# Patient Record
Sex: Female | Born: 1990 | Race: White | Hispanic: No | Marital: Single | Smoking: Never smoker
Health system: Southern US, Community
[De-identification: ages and names within clinical notes are randomized; demographics above are authoritative.]

## PROBLEM LIST (undated history)

## (undated) DIAGNOSIS — M1990 Unspecified osteoarthritis, unspecified site: Secondary | ICD-10-CM

---

## 2014-11-09 ENCOUNTER — Encounter (HOSPITAL_COMMUNITY): Payer: Self-pay

## 2014-11-09 ENCOUNTER — Emergency Department (HOSPITAL_COMMUNITY)
Admission: EM | Admit: 2014-11-09 | Discharge: 2014-11-10 | Disposition: A | Discharge status: Home / Self Care | Payer: BLUE CROSS/BLUE SHIELD | Attending: Emergency Medicine | Admitting: Emergency Medicine

## 2014-11-09 DIAGNOSIS — R1030 Lower abdominal pain, unspecified: Secondary | ICD-10-CM | POA: Diagnosis present

## 2014-11-09 DIAGNOSIS — Z880 Allergy status to penicillin: Secondary | ICD-10-CM | POA: Diagnosis not present

## 2014-11-09 DIAGNOSIS — K5900 Constipation, unspecified: Secondary | ICD-10-CM

## 2014-11-09 DIAGNOSIS — R109 Unspecified abdominal pain: Secondary | ICD-10-CM

## 2014-11-09 DIAGNOSIS — Z79899 Other long term (current) drug therapy: Secondary | ICD-10-CM | POA: Diagnosis not present

## 2014-11-09 DIAGNOSIS — M1990 Unspecified osteoarthritis, unspecified site: Secondary | ICD-10-CM | POA: Insufficient documentation

## 2014-11-09 DIAGNOSIS — Z3202 Encounter for pregnancy test, result negative: Secondary | ICD-10-CM | POA: Diagnosis not present

## 2014-11-09 HISTORY — DX: Unspecified osteoarthritis, unspecified site: M19.90

## 2014-11-09 NOTE — ED Notes (Signed)
Pt complains of abdominal pain for one week, no BM in 8 days, she's had ovarian cysts bursts before and the pain is similar but she's more concerned about a possible blockage

## 2014-11-10 ENCOUNTER — Emergency Department (HOSPITAL_COMMUNITY): Payer: BLUE CROSS/BLUE SHIELD

## 2014-11-10 LAB — URINALYSIS, ROUTINE W REFLEX MICROSCOPIC
Bilirubin Urine: NEGATIVE
Glucose, UA: NEGATIVE mg/dL
Ketones, ur: NEGATIVE mg/dL
Leukocytes, UA: NEGATIVE
Nitrite: NEGATIVE
PH: 7 (ref 5.0–8.0)
Protein, ur: NEGATIVE mg/dL
SPECIFIC GRAVITY, URINE: 1.03 (ref 1.005–1.030)
UROBILINOGEN UA: 1 mg/dL (ref 0.0–1.0)

## 2014-11-10 LAB — CBC WITH DIFFERENTIAL/PLATELET
BASOS ABS: 0.1 10*3/uL (ref 0.0–0.1)
Basophils Relative: 1 %
EOS PCT: 4 %
Eosinophils Absolute: 0.4 10*3/uL (ref 0.0–0.7)
HEMATOCRIT: 36.2 % (ref 36.0–46.0)
Hemoglobin: 12.6 g/dL (ref 12.0–15.0)
LYMPHS ABS: 3.9 10*3/uL (ref 0.7–4.0)
LYMPHS PCT: 37 %
MCH: 30 pg (ref 26.0–34.0)
MCHC: 34.8 g/dL (ref 30.0–36.0)
MCV: 86.2 fL (ref 78.0–100.0)
MONO ABS: 0.8 10*3/uL (ref 0.1–1.0)
MONOS PCT: 8 %
NEUTROS ABS: 5.3 10*3/uL (ref 1.7–7.7)
Neutrophils Relative %: 50 %
PLATELETS: 329 10*3/uL (ref 150–400)
RBC: 4.2 MIL/uL (ref 3.87–5.11)
RDW: 12 % (ref 11.5–15.5)
WBC: 10.5 10*3/uL (ref 4.0–10.5)

## 2014-11-10 LAB — I-STAT CHEM 8, ED
BUN: 12 mg/dL (ref 6–20)
CALCIUM ION: 1.19 mmol/L (ref 1.12–1.23)
CREATININE: 0.6 mg/dL (ref 0.44–1.00)
Chloride: 103 mmol/L (ref 101–111)
GLUCOSE: 103 mg/dL — AB (ref 65–99)
HCT: 40 % (ref 36.0–46.0)
HEMOGLOBIN: 13.6 g/dL (ref 12.0–15.0)
POTASSIUM: 3.8 mmol/L (ref 3.5–5.1)
Sodium: 141 mmol/L (ref 135–145)
TCO2: 24 mmol/L (ref 0–100)

## 2014-11-10 LAB — URINE MICROSCOPIC-ADD ON

## 2014-11-10 LAB — LIPASE, BLOOD: Lipase: 21 U/L — ABNORMAL LOW (ref 22–51)

## 2014-11-10 LAB — POC URINE PREG, ED: Preg Test, Ur: NEGATIVE

## 2014-11-10 MED ORDER — POLYETHYLENE GLYCOL 3350 17 G PO PACK
17.0000 g | PACK | Freq: Every day | ORAL | Status: DC
  Administered 2014-11-10: 17 g via ORAL
  Filled 2014-11-10: qty 1

## 2014-11-10 NOTE — ED Provider Notes (Signed)
CSN: [id number]     Arrival date & time 11/09/14  2152 History  This chart was scribed for Azalia Bilis, MD by Budd Palmer, ED Scribe. This patient was seen in room WA02/WA02 and the patient's care was started at 1:08 AM.    Chief Complaint  Patient presents with  . Abdominal Pain   The history is provided by the patient and a parent. No language interpreter was used.   HPI Comments: Morgan Gomez is a 24 y.o. female who presents to the Emergency Department complaining of constant, aching, lower abdominal pain onset 8 days ago. She reports associated constipation and abdominal distension. She notes her last BM was 8 days ago, had some blood in it, and was very small. She was seen by Urgent Care for this 6 days ago where she was given pain medication. She notes a PMHx of ovarian cysts, but states that this pain feels different. Pt denies a PMHx of constipation. Pt is allergic to penicillin.  Past Medical History  Diagnosis Date  . Arthritis    History reviewed. No pertinent past surgical history. History reviewed. No pertinent family history. Social History  Substance Use Topics  . Smoking status: Never Smoker   . Smokeless tobacco: None  . Alcohol Use: No   OB History    No data available     Review of Systems A complete 10 system review of systems was obtained and all systems are negative except as noted in the HPI and PMH.    Allergies  Penicillins  Home Medications   Prior to Admission medications   Medication Sig Start Date End Date Taking? Authorizing Provider  Adalimumab (HUMIRA) 40 MG/0.8ML PSKT Inject 40 mg into the skin every 14 (fourteen) days. Every 2 weeks   Yes Historical Provider, MD  HYDROcodone-acetaminophen (NORCO/VICODIN) 5-325 MG per tablet Take 1-2 tablets by mouth every 6 (six) hours as needed. For pain. 11/03/14  Yes Historical Provider, MD  MONO-LINYAH 0.25-35 MG-MCG tablet Take 1 tablet by mouth daily. 10/15/14  Yes Historical Provider, MD   ondansetron (ZOFRAN-ODT) 8 MG disintegrating tablet Take 8 mg by mouth every 6 (six) hours as needed. nausea 11/03/14  Yes Historical Provider, MD   BP 125/79 mmHg  Pulse 70  Temp(Src) 98.7 F (37.1 C) (Oral)  Resp 13  Ht  (1.626 m)  Wt 128 lb (58.06 kg)  BMI 21.96 kg/m2  SpO2 99%  LMP 11/09/2014 Physical Exam  Constitutional: She is oriented to person, place, and time. She appears well-developed and well-nourished. No distress.  HENT:  Head: Normocephalic and atraumatic.  Eyes: EOM are normal.  Neck: Normal range of motion.  Cardiovascular: Normal rate, regular rhythm and normal heart sounds.   Pulmonary/Chest: Effort normal and breath sounds normal.  Abdominal: Soft. She exhibits no distension. There is no tenderness.  Genitourinary:  No rectal bleeding, no fecal impactions, no masses, no stool felt on rectal exam. Female chaperone present.  Musculoskeletal: Normal range of motion.  Neurological: She is alert and oriented to person, place, and time.  Skin: Skin is warm and dry.  Psychiatric: She has a normal mood and affect. Judgment normal.  Nursing note and vitals reviewed.   ED Course  Procedures  DIAGNOSTIC STUDIES: Oxygen Saturation is 100% on RA, normal by my interpretation.    COORDINATION OF CARE: 1:12 AM - Discussed plans to order diagnostic studies and imaging. Pt advised of plan for treatment and pt agrees.  1:21 AM - Returned to continue physical exam  after phone call interrupted.  Labs Review Labs Reviewed  LIPASE, BLOOD - Abnormal; Notable for the following:    Lipase 21 (*)    All other components within normal limits  URINALYSIS, ROUTINE W REFLEX MICROSCOPIC (NOT AT [facility]) - Abnormal; Notable for the following:    APPearance CLOUDY (*)    Hgb urine dipstick MODERATE (*)    All other components within normal limits  I-STAT CHEM 8, ED - Abnormal; Notable for the following:    Glucose, Bld 103 (*)    All other components within normal limits   CBC WITH DIFFERENTIAL/PLATELET  URINE MICROSCOPIC-ADD ON  POC URINE PREG, ED    Imaging Review Dg Abd 2 Views  11/10/2014   CLINICAL DATA:  Constipation, abdominal pain  EXAM: ABDOMEN - 2 VIEW  COMPARISON:  None.  FINDINGS: Nonobstructive bowel gas pattern.  No evidence of free air under the diaphragm on the upright view.  Moderate colonic stool burden, suggesting constipation.  Visualized osseous structures are within normal limits.  IMPRESSION: No evidence of small bowel obstruction or free air.  Moderate colonic stool burden, suggesting constipation.   Electronically Signed   By: Charline Bills M.D.   On: 11/10/2014 02:52   I have personally reviewed and evaluated these images and lab results as part of my medical decision-making.   EKG Interpretation None      MDM   Final diagnoses:  Abdominal pain    Clinically I suspect constipation.  There is no fecal impaction present.  Patient will start twice a day MiraLAX until she has successful bowel movements which point she'll move to daily MiraLAX.  I recommend that she not take the Vicodin that was prescribed to her for discomfort and pain as I think this will worsen her issue.  I personally performed the services described in this documentation, which was scribed in my presence. The recorded information has been reviewed and is accurate.      Azalia Bilis, MD 11/10/14 [phone]

## 2014-11-10 NOTE — ED Notes (Signed)
MD at bedside. 

## 2014-11-10 NOTE — Discharge Instructions (Signed)

## 2015-05-01 ENCOUNTER — Emergency Department (HOSPITAL_BASED_OUTPATIENT_CLINIC_OR_DEPARTMENT_OTHER): Payer: BLUE CROSS/BLUE SHIELD

## 2015-05-01 ENCOUNTER — Emergency Department (HOSPITAL_BASED_OUTPATIENT_CLINIC_OR_DEPARTMENT_OTHER)
Admission: EM | Admit: 2015-05-01 | Discharge: 2015-05-02 | Disposition: A | Discharge status: Home / Self Care | Payer: BLUE CROSS/BLUE SHIELD | Attending: Emergency Medicine | Admitting: Emergency Medicine

## 2015-05-01 ENCOUNTER — Encounter (HOSPITAL_BASED_OUTPATIENT_CLINIC_OR_DEPARTMENT_OTHER): Payer: Self-pay | Admitting: Emergency Medicine

## 2015-05-01 DIAGNOSIS — Z880 Allergy status to penicillin: Secondary | ICD-10-CM | POA: Diagnosis not present

## 2015-05-01 DIAGNOSIS — Y998 Other external cause status: Secondary | ICD-10-CM | POA: Insufficient documentation

## 2015-05-01 DIAGNOSIS — S59902A Unspecified injury of left elbow, initial encounter: Secondary | ICD-10-CM | POA: Diagnosis present

## 2015-05-01 DIAGNOSIS — W01198A Fall on same level from slipping, tripping and stumbling with subsequent striking against other object, initial encounter: Secondary | ICD-10-CM | POA: Insufficient documentation

## 2015-05-01 DIAGNOSIS — M1990 Unspecified osteoarthritis, unspecified site: Secondary | ICD-10-CM | POA: Insufficient documentation

## 2015-05-01 DIAGNOSIS — Y9389 Activity, other specified: Secondary | ICD-10-CM | POA: Diagnosis not present

## 2015-05-01 DIAGNOSIS — S5402XA Injury of ulnar nerve at forearm level, left arm, initial encounter: Secondary | ICD-10-CM | POA: Insufficient documentation

## 2015-05-01 DIAGNOSIS — Y9289 Other specified places as the place of occurrence of the external cause: Secondary | ICD-10-CM | POA: Insufficient documentation

## 2015-05-01 DIAGNOSIS — Z79899 Other long term (current) drug therapy: Secondary | ICD-10-CM | POA: Diagnosis not present

## 2015-05-01 NOTE — ED Notes (Signed)
Patient states that she hit her left elbow earlier today and her left arm went numbness from her elbow to her fingers . CMS WNL

## 2015-05-02 MED ORDER — NAPROXEN 375 MG PO TABS: 375.0000 mg | ORAL_TABLET | Freq: Two times a day (BID) | ORAL | Status: AC

## 2015-05-02 NOTE — Discharge Instructions (Signed)
Ulnar Nerve Contusion With Rehab The ulnar nerve lies near the surface of the skin as it passes by the elbow. This location causes it to be susceptible to injury. An ulnar nerve contusion is a bruise of the nerve. It is the result of direct trauma to the elbow. Ulnar nerve contusions are characterized by pain, weakness, and loss of feeling in the hand. SYMPTOMS   Signs of nerve damage include: tingling, numbness, weakness, and/or loss of feeling in the hand, specifically the little finger and ring finger.  Sharp pains that may shoot from the elbow to the wrist and hand.  Decreased hand function.  Tenderness and/ or inflammation in the elbow.  Muscle wasting (atrophy) in the hand. CAUSES  Ulnar nerve contusions are caused by direct trauma to the elbow that results in bleeding which enters the nerve. RISK INCREASES WITH:  Contact sports (football, soccer, or rugby).  Bleeding disorders.  Taking blood thinning medicine (warfarin [Coumadin], aspirin, or nonsteroidal anti-inflammatory medications).  Diabetes mellitus.  Underactive thyroid gland (hypothyroidism). PREVENTION  Wear properly fitted and padded protective equipment.  Only take blood thinning medication when necessary. PROGNOSIS  Ulnar nerve contusions usually heal within 6 weeks. Healing often occurs spontaneously, but treatment helps reduce symptoms.  RELATED COMPLICATIONS   Permanent nerve damage, including pain, numbness, tingling, or weakness in the hand (rare).  Weak grip.  Prolonged healing time, if improperly treated or re-injured. TREATMENT  Treatment initially involves resting from any activities that aggravate the symptoms, and the use of ice and medications to help reduce pain and inflammation. The use of strengthening and stretching exercises may help reduce pain with activity. These exercises may be performed at home or with referral to a therapist. Your caregiver may recommend that you splint the elbow at  night to help healing of the nerve. If symptoms persist despite conservative (non-surgical) treatment, then surgery may be recommended to free the nerve. MEDICATION   If pain medication is necessary, then nonsteroidal anti-inflammatory medications, such as aspirin and ibuprofen, or other minor pain relievers, such as acetaminophen, are often recommended.  Do not take pain medication within 7 days before surgery.  Prescription pain relievers may be given if deemed necessary by your caregiver. Use only as directed and only as much as you need. COLD THERAPY  Cold treatment (icing) relieves pain and reduces inflammation. Cold treatment should be applied for 10 to 15 minutes every 2 to 3 hours for inflammation and pain and immediately after any activity that aggravates your symptoms. Use ice packs or massage the area with a piece of ice (ice massage). SEEK MEDICAL CARE IF:   Treatment seems to offer no benefit, or the condition worsens.  Any medications produce adverse side effects. EXERCISES RANGE OF MOTION (ROM) AND STRETCHING EXERCISES - Ulnar Nerve Contusion These exercises may help you when beginning to rehabilitate your injury. Do not begin these exercises until your physician, physical therapist or athletic trainer advises you to do so. Discontinue any exercise that worsens your symptoms. Contact your physician with instructions on how to continue. Your symptoms may resolve with or without further involvement from your physician, physical therapist or athletic trainer. While completing these exercises, remember:  Restoring tissue flexibility helps normal motion to return to the joints. This allows healthier, less painful movement and activity.  An effective stretch should be held for at least 30 seconds.  A stretch should never be painful. You should only feel a gentle lengthening or release in the stretched tissue. RANGE  OF MOTION - Extension  Hold your right / left arm at your side and  straighten your elbow as far as you can using your right / left arm muscles.  Straighten the right / left elbow farther by gently pushing down on your forearm until you feel a gentle stretch on the inside of your elbow. Hold this position for __________ seconds.  Slowly return to the starting position.  RANGE OF MOTION - Flexion  Hold your right / left arm at your side and bend your elbow as far as you can using your right / left arm muscles.  Bend the right / left elbow farther by gently pushing up on your forearm until you feel a gentle stretch on the outside of your elbow. Hold this position for __________ seconds.  Slowly return to the starting position.  RANGE OF MOTION - Wrist Flexion, Active-Assisted  Extend your right / left elbow with your fingers pointing down.*  Gently pull the back of your hand towards you until you feel a gentle stretch on the top of your forearm.  Hold this position for __________ seconds. Repeat __________ times. Complete this exercise __________ times per day.  *If directed by your physician, physical therapist or athletic trainer, complete this stretch with your elbow bent rather than extended. RANGE OF MOTION - Wrist Extension, Active-Assisted  Extend your right / left elbow and turn your palm upwards.*  Gently pull your palm/fingertips back so your wrist extends and your fingers point more toward the ground.  You should feel a gentle stretch on the inside of your forearm.  Hold this position for __________ seconds. Repeat __________ times. Complete this exercise __________ times per day. *If directed by your physician, physical therapist or athletic trainer, complete this stretch with your elbow bent, rather than extended. RANGE OF MOTION - Supination, Active  Stand or sit with your elbows at your side. Bend your right / left elbow to 90 degrees.  Turn your palm upward until you feel a gentle stretch on the inside of your forearm.  Hold this  position for __________ seconds. Slowly release and return to the starting position.  RANGE OF MOTION - Pronation, Active  Stand or sit with your elbows at your side. Bend your right / left elbow to 90 degrees.  Turn your palm downward until you feel a gentle stretch on the top of your forearm.  Hold this position for __________ seconds. Slowly release and return to the starting position.  STRETCH - Wrist Flexion   Place the back of your right / left hand on a tabletop leaving your elbow slightly bent. Your fingers should point away from your body.  Gently press the back of your hand down onto the table by straightening your elbow. You should feel a stretch on the top of your forearm.  Hold this position for __________ seconds.  STRETCH - Wrist Extension   Place your right / left fingertips on a tabletop leaving your elbow slightly bent. Your fingers should point backwards.  Gently press your fingers and palm down onto the table by straightening your elbow. You should feel a stretch on the inside of your forearm.  Hold this position for __________ seconds.  STRENGTHENING EXERCISES - Ulnar Nerve Contusion These exercises may help you when beginning to rehabilitate your injury. Do not begin these exercises until your physician, physical therapist or athletic trainer advises you to do so. Discontinue any exercise that worsens your symptoms. Contact your physician for instructions on  how to continue. They may resolve your symptoms with or without further involvement from your physician, physical therapist or athletic trainer. While completing these exercises, remember:   Muscles can gain both the endurance and the strength needed for everyday activities through controlled exercises.  Complete these exercises as instructed by your physician, physical therapist or athletic trainer. Progress with the resistance and repetition exercises only as your caregiver advises. STRENGTH - Wrist  Flexors  Sit with your right / left forearm palm-up and fully supported. Your elbow should be resting below the height of your shoulder. Allow your wrist to extend over the edge of the surface.  Loosely holding a __________ weight or a piece of rubber exercise band/tubing, slowly curl your hand up toward your forearm.  Hold this position for __________ seconds. Slowly lower the wrist back to the starting position in a controlled manner. Repeat __________ times. Complete this exercise __________ times per day.  STRENGTH - Wrist Extensors  Sit with your right / left forearm palm-down and fully supported. Your elbow should be resting below the height of your shoulder. Allow your wrist to extend over the edge of the surface.  Loosely holding a __________ weight or a piece of rubber exercise band/tubing, slowly curl your hand up toward your forearm.  Hold this position for __________ seconds. Slowly lower the wrist back to the starting position in a controlled manner.  STRENGTH - Ulnar Deviators  Stand with a ____________________ weight in your right / left hand or sit holding on to the rubber exercise band/tubing with your opposite arm supported.  Move your wrist so that your pinkie travels toward your forearm and your thumb moves away from your forearm.  Hold this position for __________ seconds and then slowly lower the wrist back to the starting position. Repeat __________ times. Complete this exercise __________ times per day STRENGTH - Radial Deviators  Stand with a ____________________ weight in your right / left hand or sit holding on to the rubber exercise band/tubing with your arm supported.  Raise your hand upward in front of you or pull up on the rubber tubing.  Hold this position for __________ seconds and then slowly lower the wrist back to the starting position. Repeat __________ times. Complete this exercise __________ times per day. STRENGTH - Grip  Grasp a tennis ball,  a dense sponge, or a large, rolled sock in your hand.  Squeeze as hard as you can without increasing any pain.  Hold this position for __________ seconds. Release your grip slowly. Repeat __________ times. Complete this exercise __________ times per day.    This information is not intended to replace advice given to you by your health care provider. Make sure you discuss any questions you have with your health care provider.   Document Released: 02/06/2005 Document Revised: 06/23/2014 Document Reviewed: 05/21/2008 Elsevier Interactive Patient Education [employer]

## 2015-05-02 NOTE — ED Provider Notes (Signed)
CSN: [id number]     Arrival date & time 05/01/15  2034 History   First MD Initiated Contact with Patient 05/02/15 0013     Chief Complaint  Patient presents with  . Elbow Pain     (Consider location/radiation/quality/duration/timing/severity/associated sxs/prior Treatment) HPI   This is a 25 year old female presents emergency Department with chief complaint of left elbow injury. She has a history of psoriatic arthritis and is currently on Humira. The patient was engaged in a [place] competition today. During competitive sit up. She fell backward and hit her left elbow against the concrete floor. She complains of immediate severe right pain radiating down from the medial part of the elbow to the left fourth and fifth digits. She complains of continued pain for the past 12 hours. She has some bruising around the medial side of the elbow near the ulnar nerve pathway. She is able to flex and extend. She has previous injuries to that area.  Past Medical History  Diagnosis Date  . Arthritis    History reviewed. No pertinent past surgical history. History reviewed. No pertinent family history. Social History  Substance Use Topics  . Smoking status: Never Smoker   . Smokeless tobacco: None  . Alcohol Use: No   OB History    No data available     Review of Systems  Ten systems reviewed and are negative for acute change, except as noted in the HPI.    Allergies  Penicillins  Home Medications   Prior to Admission medications   Medication Sig Start Date End Date Taking? Authorizing Provider  Adalimumab (HUMIRA) 40 MG/0.8ML PSKT Inject 40 mg into the skin every 14 (fourteen) days. Every 2 weeks    Historical Provider, MD  HYDROcodone-acetaminophen (NORCO/VICODIN) 5-325 MG per tablet Take 1-2 tablets by mouth every 6 (six) hours as needed. For pain. 11/03/14   Historical Provider, MD  MONO-LINYAH 0.25-35 MG-MCG tablet Take 1 tablet by mouth daily. 10/15/14   Historical Provider, MD   ondansetron (ZOFRAN-ODT) 8 MG disintegrating tablet Take 8 mg by mouth every 6 (six) hours as needed. nausea 11/03/14   Historical Provider, MD   BP 150/92 mmHg  Pulse 80  Temp(Src) 99.2 F (37.3 C) (Oral)  Resp 18  Ht 5\' 4"  (1.626 m)  Wt 58.968 kg  BMI 22.30 kg/m2  SpO2 100%  LMP 05/01/2015 Physical Exam  Constitutional: She is oriented to person, place, and time. She appears well-developed and well-nourished. No distress.  HENT:  Head: Normocephalic and atraumatic.  Eyes: Conjunctivae are normal. No scleral icterus.  Neck: Normal range of motion.  Cardiovascular: Normal rate, regular rhythm and normal heart sounds.  Exam reveals no gallop and no friction rub.   No murmur heard. Pulmonary/Chest: Effort normal and breath sounds normal. No respiratory distress.  Abdominal: Soft. Bowel sounds are normal. She exhibits no distension and no mass. There is no tenderness. There is no guarding.  Musculoskeletal:  Left elbow examination is performed. She has full flexion and extension, pronation and supination of the left elbow. She is holding the elbow in mid flexion. She is tender to palpation along the medial condyle with some bruising. She has strong and equal bilateral grip strength of the hand. She has subjective numbness in the ulnar nerve distribution. Strong radial pulse.  Neurological: She is alert and oriented to person, place, and time.  Skin: Skin is warm and dry. She is not diaphoretic.  Nursing note and vitals reviewed.   ED Course  Procedures (including  critical care time) Labs Review Labs Reviewed - No data to display  Imaging Review Dg Elbow Complete Left  05/01/2015  CLINICAL DATA:  Left elbow pain after injury EXAM: LEFT ELBOW - COMPLETE 3+ VIEW COMPARISON:  None. FINDINGS: There is no evidence of fracture, dislocation, or joint effusion. There is no evidence of arthropathy or other focal bone abnormality. Soft tissues are unremarkable. IMPRESSION: Negative.  Electronically Signed   By: Delbert Phenix M.D.   On: 05/01/2015 20:50   I have personally reviewed and evaluated these images and lab results as part of my medical decision-making.   EKG Interpretation None      MDM   Final diagnoses:  Contusion of ulnar nerve, left, initial encounter    Patient with contusion of the left ulnar nerve. Patient placed in this bleeding. Home with conservative measures, giving. Avoid all activity with the arm. Follow up with a hand specialist. She appears safe for discharge at this time    Arthor Captain, PA-C 05/02/15 0100  Shon Baton, MD 05/02/15 [phone]

## 2016-07-21 IMAGING — CR DG ELBOW COMPLETE 3+V*L*
4 series · 4 of 4 positions shown · non-contrast
Comparison: None.

CLINICAL DATA: Left elbow pain after injury

EXAM:
LEFT ELBOW - COMPLETE 3+ VIEW

[x elbow joint ap left]
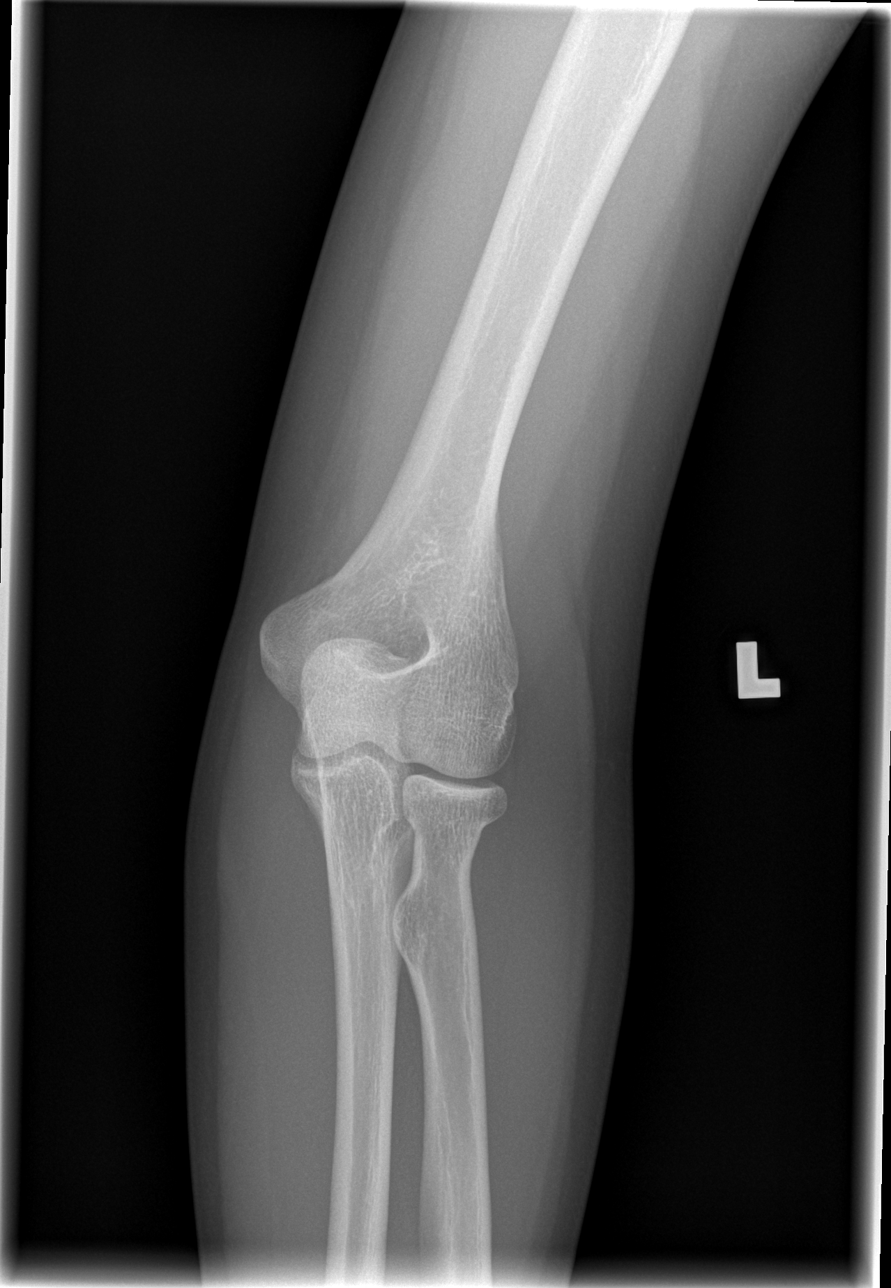

[x elbow joint obl. left (1 of 2)]
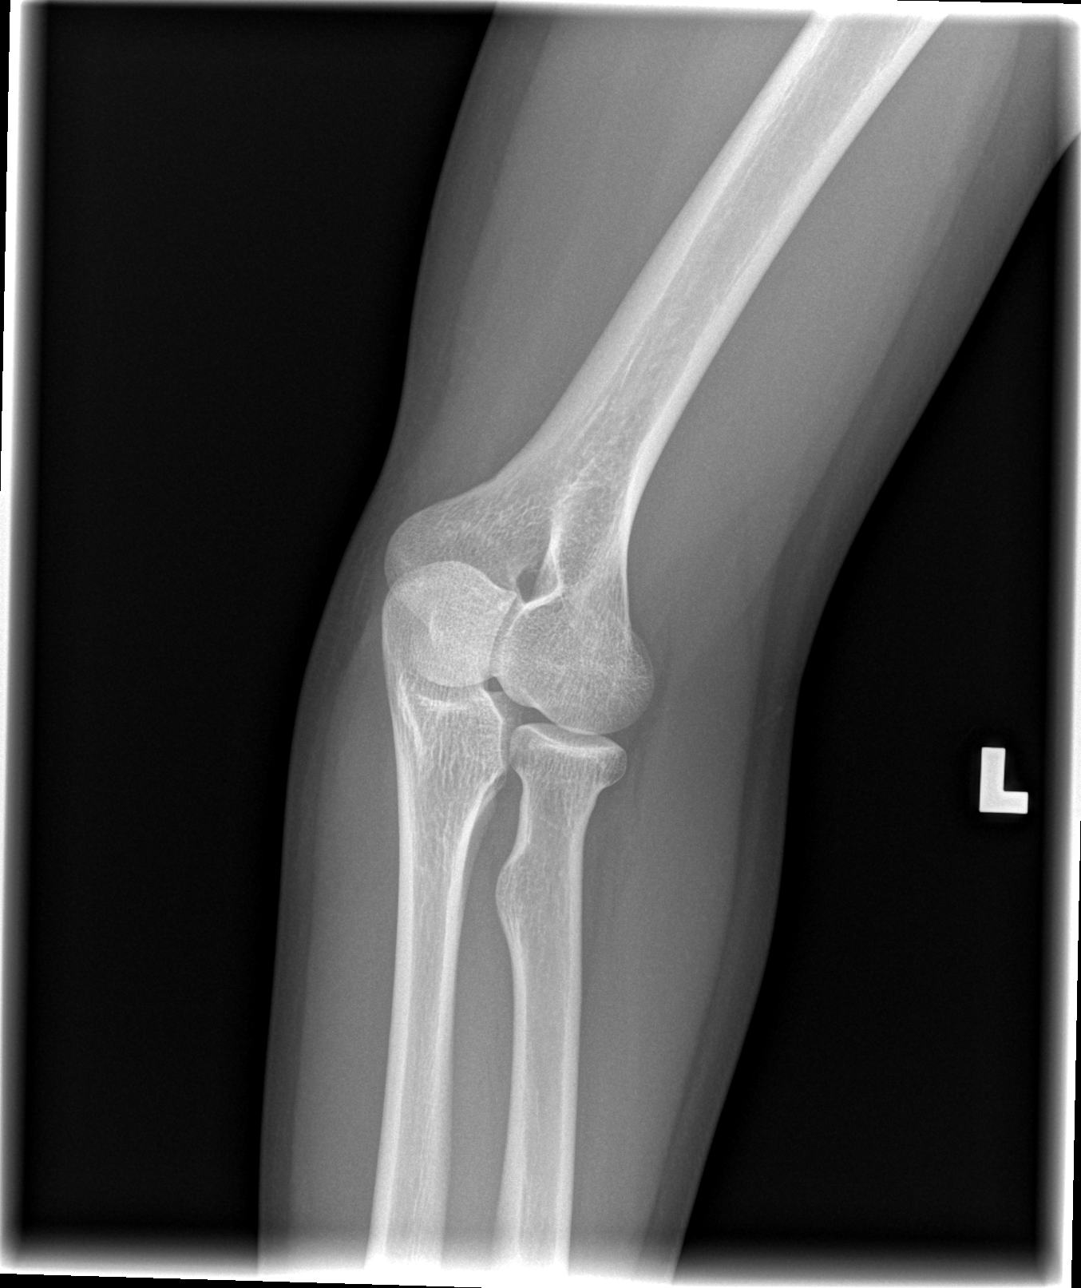

[x elbow joint obl. left (2 of 2)]
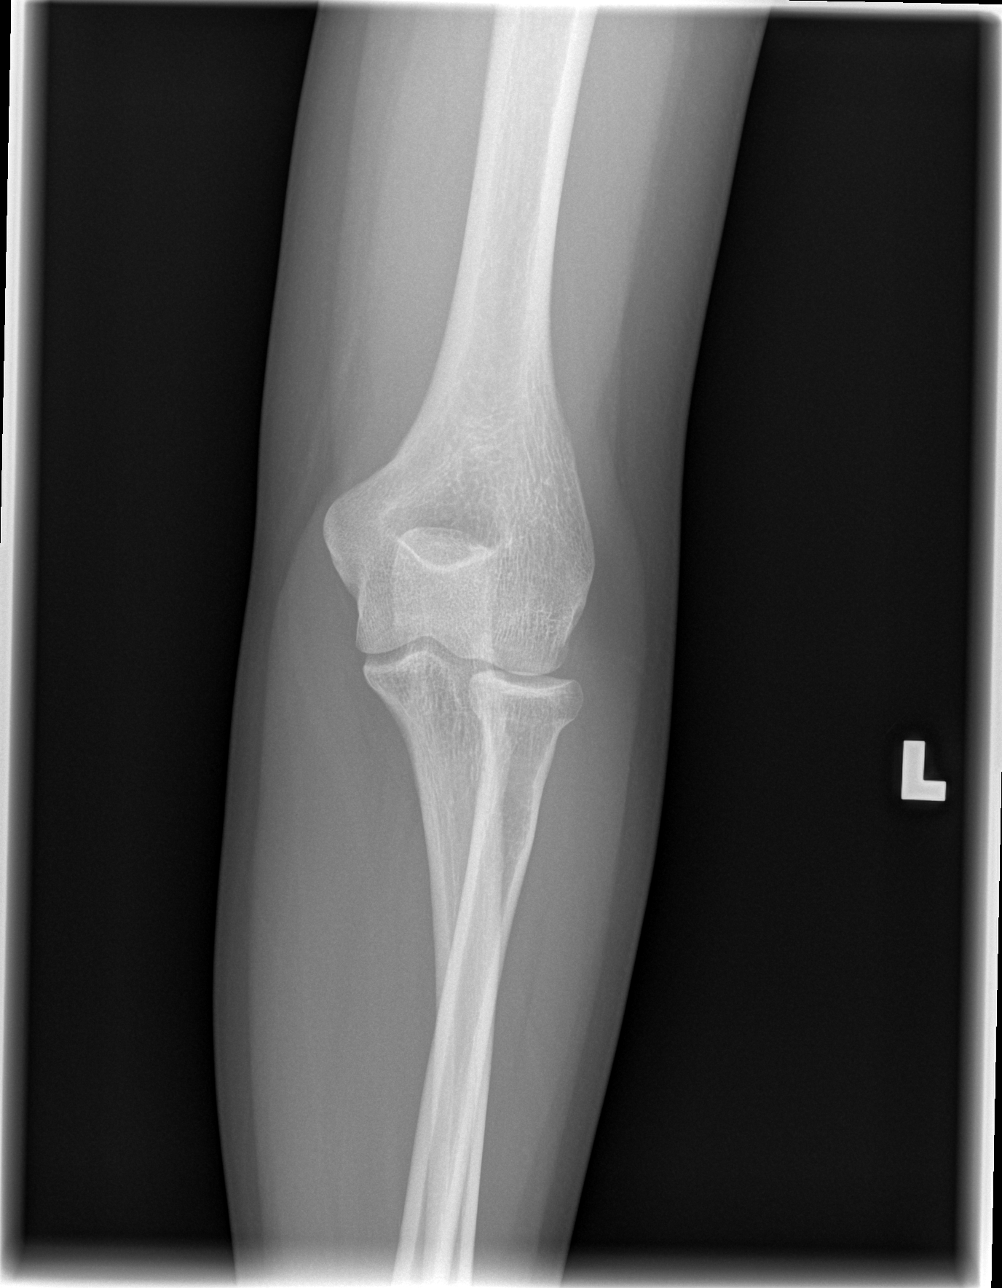

[x elbow joint lat left]
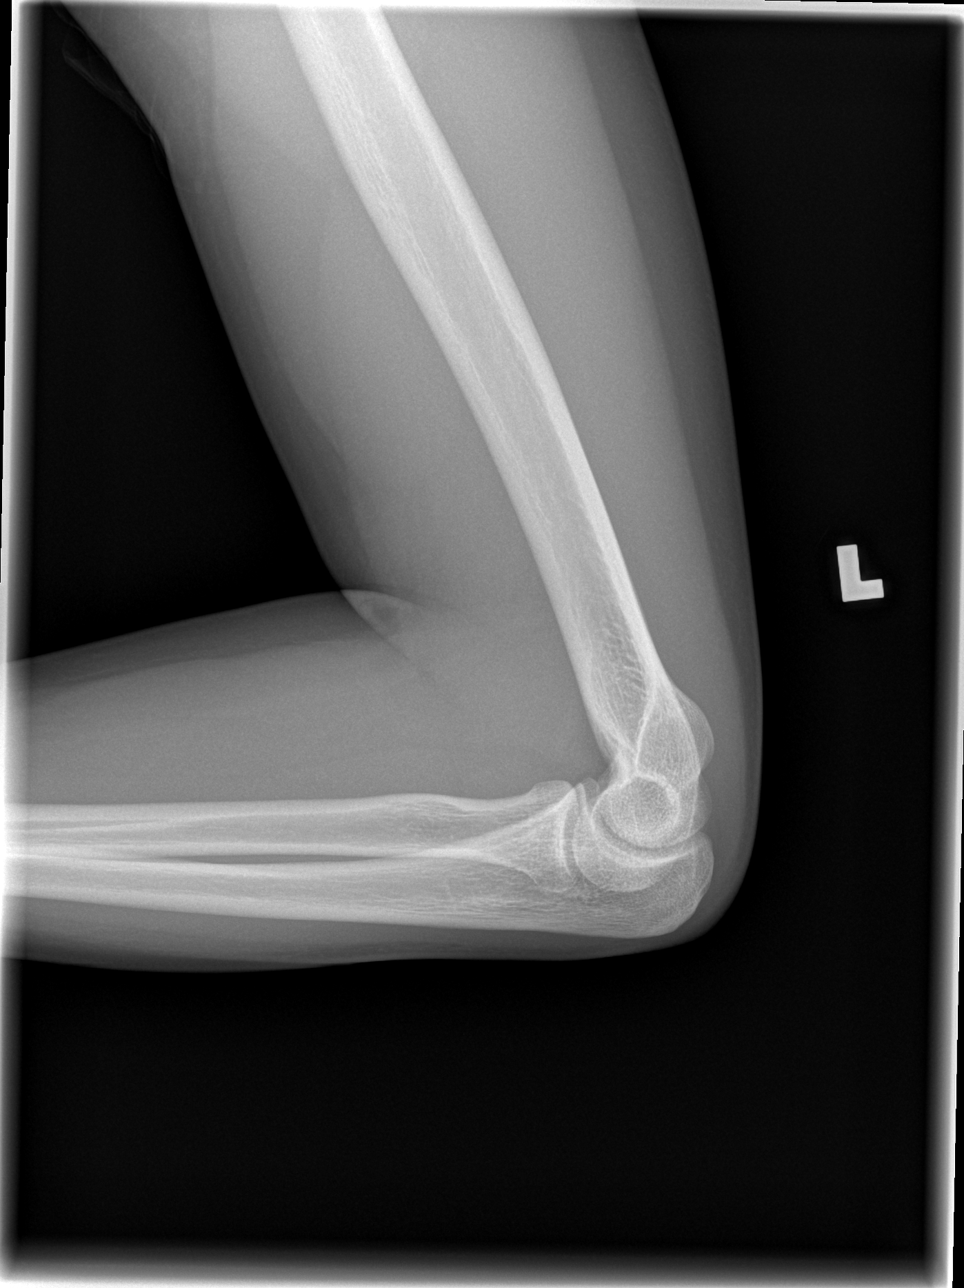

[4 of 4 positions shown; findings below may reference images not displayed]

FINDINGS: There is no evidence of fracture, dislocation, or joint effusion.
There is no evidence of arthropathy or other focal bone abnormality.
Soft tissues are unremarkable.
IMPRESSION: Negative.
